# Patient Record
Sex: Female | Born: 2000 | Race: White | Hispanic: No | Marital: Single | Smoking: Never smoker
Health system: Southern US, Community
[De-identification: ages and names within clinical notes are randomized; demographics above are authoritative.]

---
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
## 2020-03-15 ENCOUNTER — Ambulatory Visit (HOSPITAL_COMMUNITY): Payer: No Payment, Other | Admitting: Physician Assistant

## 2020-03-21 ENCOUNTER — Ambulatory Visit (HOSPITAL_COMMUNITY): Payer: No Payment, Other | Admitting: Psychiatry
